# Patient Record
Sex: Female | Born: 1985 | Hispanic: Yes | Marital: Married | Smoking: Never smoker
Health system: Southern US, Community
[De-identification: ages and names within clinical notes are randomized; demographics above are authoritative.]

---

## 2014-12-04 ENCOUNTER — Emergency Department (HOSPITAL_COMMUNITY): Payer: No Typology Code available for payment source

## 2014-12-04 ENCOUNTER — Encounter (HOSPITAL_COMMUNITY): Payer: Self-pay | Admitting: *Deleted

## 2014-12-04 ENCOUNTER — Emergency Department (HOSPITAL_COMMUNITY)
Admission: EM | Admit: 2014-12-04 | Discharge: 2014-12-04 | Disposition: A | Discharge status: Home / Self Care | Payer: No Typology Code available for payment source | Attending: Emergency Medicine | Admitting: Emergency Medicine

## 2014-12-04 DIAGNOSIS — S3991XA Unspecified injury of abdomen, initial encounter: Secondary | ICD-10-CM | POA: Insufficient documentation

## 2014-12-04 DIAGNOSIS — Y92410 Unspecified street and highway as the place of occurrence of the external cause: Secondary | ICD-10-CM | POA: Insufficient documentation

## 2014-12-04 DIAGNOSIS — Z3202 Encounter for pregnancy test, result negative: Secondary | ICD-10-CM | POA: Diagnosis not present

## 2014-12-04 DIAGNOSIS — Y9389 Activity, other specified: Secondary | ICD-10-CM | POA: Insufficient documentation

## 2014-12-04 DIAGNOSIS — S0990XA Unspecified injury of head, initial encounter: Secondary | ICD-10-CM | POA: Diagnosis not present

## 2014-12-04 DIAGNOSIS — S3992XA Unspecified injury of lower back, initial encounter: Secondary | ICD-10-CM | POA: Diagnosis not present

## 2014-12-04 DIAGNOSIS — Y998 Other external cause status: Secondary | ICD-10-CM | POA: Diagnosis not present

## 2014-12-04 DIAGNOSIS — S199XXA Unspecified injury of neck, initial encounter: Secondary | ICD-10-CM | POA: Diagnosis not present

## 2014-12-04 DIAGNOSIS — T07XXXA Unspecified multiple injuries, initial encounter: Secondary | ICD-10-CM

## 2014-12-04 LAB — I-STAT CHEM 8, ED
BUN: 10 mg/dL (ref 6–20)
Calcium, Ion: 1.16 mmol/L (ref 1.12–1.23)
Chloride: 105 mmol/L (ref 101–111)
Creatinine, Ser: 0.7 mg/dL (ref 0.44–1.00)
Glucose, Bld: 107 mg/dL — ABNORMAL HIGH (ref 65–99)
HEMATOCRIT: 38 % (ref 36.0–46.0)
HEMOGLOBIN: 12.9 g/dL (ref 12.0–15.0)
Potassium: 3.6 mmol/L (ref 3.5–5.1)
SODIUM: 140 mmol/L (ref 135–145)
TCO2: 24 mmol/L (ref 0–100)

## 2014-12-04 LAB — POC URINE PREG, ED: Preg Test, Ur: NEGATIVE

## 2014-12-04 MED ORDER — FENTANYL CITRATE (PF) 100 MCG/2ML IJ SOLN: 50.0000 ug | INTRAMUSCULAR | Status: DC | PRN

## 2014-12-04 MED ORDER — IOHEXOL 300 MG/ML  SOLN
100.0000 mL | Freq: Once | INTRAMUSCULAR | Status: AC | PRN
  Administered 2014-12-04: 100 mL via INTRAVENOUS

## 2014-12-04 MED ORDER — IBUPROFEN 800 MG PO TABS: 800.0000 mg | ORAL_TABLET | Freq: Four times a day (QID) | ORAL | Status: DC | PRN

## 2014-12-04 NOTE — ED Notes (Signed)
Pt is also c/o pain to left side of abdomen
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
## 2014-12-04 NOTE — Discharge Instructions (Signed)
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
## 2019-01-14 ENCOUNTER — Telehealth: Payer: Self-pay

## 2019-01-14 NOTE — Telephone Encounter (Signed)
Pt. Eligibility is 01/14/2019 till 01/14/2020 with [facility]. Faxed over pt. [facility] application and other docs.  -Layna Roeper 

## 2019-02-04 ENCOUNTER — Other Ambulatory Visit: Payer: Self-pay

## 2019-02-04 ENCOUNTER — Ambulatory Visit: Payer: Self-pay | Admitting: Physician Assistant

## 2019-02-04 ENCOUNTER — Encounter: Payer: Self-pay | Admitting: Physician Assistant

## 2019-02-04 VITALS — BP 114/80 | HR 81 | Temp 98.1°F | Wt 183.6 lb

## 2019-02-04 DIAGNOSIS — Z7689 Persons encountering health services in other specified circumstances: Secondary | ICD-10-CM

## 2019-02-04 NOTE — Progress Notes (Signed)
BP 114/80   Pulse 81   Temp 98.1 F (36.7 C)   Wt 183 lb 9.6 oz (83.3 kg)   SpO2 99%    Subjective:    Patient ID: Holly West, female    DOB: 1985/10/16, 33 y.o.   MRN: [id number]  HPI: Holly West is a 33 y.o. female presenting on 02/04/2019 for New Patient (Initial Visit) (previous patient at [facility] pt last went about 2 months ago and had f/u appt scheduled to f/u. LPN explained to pt that she is to pick which PCP she should go to. pt states she will come to [facility] and will cancel her f/u appointment with [facility]) and Joint Pain (for about more than a year. pt states pain have become for frequent and more painful. pain is on her knees, fingers, and arms. pt states she has taken IBU for pain and it helps.)   HPI   Pt had a negative covid 19 screening questionnaire   Chief Complaint  Patient presents with  . New Patient (Initial Visit)    previous patient at [facility] pt last went about 2 months ago and had f/u appt scheduled to f/u. LPN explained to pt that she is to pick which PCP she should go to. pt states she will come to [facility] and will cancel her f/u appointment with [place] [facility]  . Joint Pain    for about more than a year. pt states pain have become for frequent and more painful. pain is on her knees, fingers, and arms. pt states she has taken IBU for pain and it helps.      Pt's Last PAP was august 2020.    She says her IUD expires mid-November and she is going to get a new IUD.     Relevant past medical, surgical, family and social history reviewed and updated as indicated. Interim medical history since our last visit reviewed. Allergies and medications reviewed and updated.   Current Outpatient Medications:  .  paragard intrauterine copper IUD IUD, 1 each by Intrauterine route once., Disp: , Rfl:  .  Prenatal Vit-Fe Fumarate-FA (PRENATAL VITAMIN PO), Take 1 tablet by mouth daily., Disp: , Rfl:  .  VITAMIN D PO, Take 1 tablet by mouth daily., Disp: , Rfl:      Review of  Systems  Per HPI unless specifically indicated above     Objective:    BP 114/80   Pulse 81   Temp 98.1 F (36.7 C)   Wt 183 lb 9.6 oz (83.3 kg)   SpO2 99%   Wt Readings from Last 3 Encounters:  02/04/19 183 lb 9.6 oz (83.3 kg)  12/04/14 182 lb (82.6 kg)    Physical Exam Vitals signs and nursing note reviewed.  Constitutional:      General: She is not in acute distress.    Appearance: She is not ill-appearing.  HENT:     Head: Normocephalic and atraumatic.  Pulmonary:     Effort: Pulmonary effort is normal. No respiratory distress.  Neurological:     Mental Status: She is alert and oriented to person, place, and time.     Gait: Gait normal.  Psychiatric:        Attention and Perception: Attention normal.        Speech: Speech normal.        Behavior: Behavior is cooperative.         Assessment & Plan:   Encounter Diagnosis  Name Primary?  . Encounter to establish  care Yes    While talking with pt and reviewing history and current conditions, pt decided to contintue with [facility] where she she was last seen about 2 months ago and she has appointment to be seen there again in November

## 2020-01-09 ENCOUNTER — Other Ambulatory Visit: Payer: Self-pay

## 2020-01-09 ENCOUNTER — Emergency Department (HOSPITAL_COMMUNITY): Payer: No Typology Code available for payment source

## 2020-01-09 ENCOUNTER — Emergency Department (HOSPITAL_COMMUNITY)
Admission: EM | Admit: 2020-01-09 | Discharge: 2020-01-09 | Disposition: A | Discharge status: Home / Self Care | Payer: No Typology Code available for payment source | Attending: Emergency Medicine | Admitting: Emergency Medicine

## 2020-01-09 ENCOUNTER — Encounter (HOSPITAL_COMMUNITY): Payer: Self-pay | Admitting: Emergency Medicine

## 2020-01-09 DIAGNOSIS — R079 Chest pain, unspecified: Secondary | ICD-10-CM | POA: Diagnosis present

## 2020-01-09 DIAGNOSIS — R0789 Other chest pain: Secondary | ICD-10-CM

## 2020-01-09 LAB — CBC
HCT: 38.3 % (ref 36.0–46.0)
Hemoglobin: 12.9 g/dL (ref 12.0–15.0)
MCH: 29.8 pg (ref 26.0–34.0)
MCHC: 33.7 g/dL (ref 30.0–36.0)
MCV: 88.5 fL (ref 80.0–100.0)
Platelets: 371 10*3/uL (ref 150–400)
RBC: 4.33 MIL/uL (ref 3.87–5.11)
RDW: 12.5 % (ref 11.5–15.5)
WBC: 11.9 10*3/uL — ABNORMAL HIGH (ref 4.0–10.5)
nRBC: 0 % (ref 0.0–0.2)

## 2020-01-09 LAB — TROPONIN I (HIGH SENSITIVITY)
Troponin I (High Sensitivity): <2 ng/L (ref <18)
Troponin I (High Sensitivity): <2 ng/L (ref <18)

## 2020-01-09 LAB — BASIC METABOLIC PANEL
Anion gap: 8 (ref 5–15)
BUN: 9 mg/dL (ref 6–20)
CO2: 24 mmol/L (ref 22–32)
Calcium: 8.6 mg/dL — ABNORMAL LOW (ref 8.9–10.3)
Chloride: 100 mmol/L (ref 98–111)
Creatinine, Ser: 0.51 mg/dL (ref 0.44–1.00)
GFR calc Af Amer: >60 mL/min (ref >60)
GFR calc non Af Amer: >60 mL/min (ref >60)
Glucose, Bld: 94 mg/dL (ref 70–99)
Potassium: 3.5 mmol/L (ref 3.5–5.1)
Sodium: 132 mmol/L — ABNORMAL LOW (ref 135–145)

## 2020-01-09 MED ORDER — HYDROCODONE-ACETAMINOPHEN 5-325 MG PO TABS
1.0000 | ORAL_TABLET | Freq: Once | ORAL | Status: AC
  Administered 2020-01-09: 1 via ORAL
  Filled 2020-01-09: qty 1

## 2020-01-09 MED ORDER — IBUPROFEN 800 MG PO TABS: 800.0000 mg | ORAL_TABLET | Freq: Three times a day (TID) | ORAL | 1 refills | Status: AC | PRN

## 2020-01-09 NOTE — Discharge Instructions (Addendum)
Follow up next week if not improving. °

## 2020-01-09 NOTE — ED Triage Notes (Signed)
Pt c/o sudden onset of LT sided CP that radiates down LT arm, to LT jaw, and to the LT side of her head. Pt states she feels SOB when chest pain occurs. Reports pain began this morning shortly after she woke up. States the pain feels like a "pinching, sore" pain.

## 2020-01-09 NOTE — ED Provider Notes (Signed)
[facility] EMERGENCY DEPARTMENT Provider Note   CSN: [id number] Arrival date & time: 01/09/20  1620     History Chief Complaint  Patient presents with  . Chest Pain    Holly West is a 34 y.o. female.  Patient complains of left chest pain worse with movement. No history of trauma no shortness of breath no sweating  The history is provided by the patient and medical records. A language interpreter was used.  Chest Pain Pain location:  L chest Pain quality: aching   Pain radiates to:  Does not radiate Pain severity:  Moderate Onset quality:  Sudden Timing:  Constant Progression:  Worsening Chronicity:  New Context: not breathing   Associated symptoms: no abdominal pain, no back pain, no cough, no fatigue and no headache        History reviewed. No pertinent past medical history.  There are no problems to display for this patient.   History reviewed. No pertinent surgical history.   OB History   No obstetric history on file.     No family history on file.  Social History   Tobacco Use  . Smoking status: Never Smoker  . Smokeless tobacco: Never Used  Vaping Use  . Vaping Use: Never used  Substance Use Topics  . Alcohol use: Not Currently  . Drug use: Not Currently    Home Medications Prior to Admission medications   Medication Sig Start Date End Date Taking? Authorizing Provider  acetaminophen (TYLENOL) 500 MG tablet Take 500 mg by mouth daily as needed for moderate pain.   Yes [provider]  levonorgestrel (MIRENA) 20 MCG/24HR IUD 1 each by Intrauterine route once.   Yes [provider]  ibuprofen (ADVIL) 800 MG tablet Take 1 tablet (800 mg total) by mouth every 8 (eight) hours as needed for moderate pain. 01/09/20   Bethann Berkshire, MD    Allergies    Patient has no known allergies.  Review of Systems   Review of Systems  Constitutional: Negative for appetite change and fatigue.  HENT: Negative for congestion, ear discharge  and sinus pressure.   Eyes: Negative for discharge.  Respiratory: Negative for cough.   Cardiovascular: Positive for chest pain.  Gastrointestinal: Negative for abdominal pain and diarrhea.  Genitourinary: Negative for frequency and hematuria.  Musculoskeletal: Negative for back pain.  Skin: Negative for rash.  Neurological: Negative for seizures and headaches.  Psychiatric/Behavioral: Negative for hallucinations.    Physical Exam Updated Vital Signs BP 128/81   Pulse 84   Temp 97.7 F (36.5 C) (Oral)   Resp 13   Ht 4\' 9"  (1.448 m)   Wt 88 kg   LMP 01/03/2020 (Approximate)   SpO2 100%   BMI 41.98 kg/m   Physical Exam Vitals and nursing note reviewed.  Constitutional:      Appearance: She is well-developed.  HENT:     Head: Normocephalic.     Nose: Nose normal.  Eyes:     General: No scleral icterus.    Conjunctiva/sclera: Conjunctivae normal.  Neck:     Thyroid: No thyromegaly.  Cardiovascular:     Rate and Rhythm: Normal rate and regular rhythm.     Heart sounds: No murmur heard.  No friction rub. No gallop.   Pulmonary:     Breath sounds: No stridor. No wheezing or rales.  Chest:     Chest wall: Tenderness present.  Abdominal:     General: There is no distension.     Tenderness: There  is no abdominal tenderness. There is no rebound.  Musculoskeletal:        General: Normal range of motion.     Cervical back: Neck supple.  Lymphadenopathy:     Cervical: No cervical adenopathy.  Skin:    Findings: No erythema or rash.  Neurological:     Mental Status: She is alert and oriented to person, place, and time.     Motor: No abnormal muscle tone.     Coordination: Coordination normal.  Psychiatric:        Behavior: Behavior normal.     ED Results / Procedures / Treatments   Labs (all labs ordered are listed, but only abnormal results are displayed) Labs Reviewed  BASIC METABOLIC PANEL - Abnormal; Notable for the following components:      Result Value    Sodium 132 (*)    Calcium 8.6 (*)    All other components within normal limits  CBC - Abnormal; Notable for the following components:   WBC 11.9 (*)    All other components within normal limits  TROPONIN I (HIGH SENSITIVITY)  TROPONIN I (HIGH SENSITIVITY)    EKG EKG Interpretation  Date/Time:  Sunday January 09 2020 16:27:28 EDT Ventricular Rate:  90 PR Interval:  128 QRS Duration: 84 QT Interval:  372 QTC Calculation: 455 R Axis:   48 Text Interpretation: Normal sinus rhythm Normal ECG Confirmed by Bethann Berkshire [phone]) on 01/09/2020 7:50:21 PM   Radiology DG Chest 2 View  Result Date: 01/09/2020 CLINICAL DATA:  Chest pain and shortness of breath. EXAM: CHEST - 2 VIEW COMPARISON:  None. FINDINGS: There are rounded densities projecting over the bilateral lower lung zones. There is no focal infiltrate. No large pleural effusion. No pneumothorax. The heart size is unremarkable. There is no acute osseous abnormality. IMPRESSION: 1. No acute cardiopulmonary process. 2. Rounded densities projecting over the bilateral lower lung zones may represent nipple shadows. A repeat two-view radiograph with nipple markers as an outpatient would be useful for further evaluation. Electronically Signed   By: Katherine Mantle M.D.   On: 01/09/2020 16:51    Procedures Procedures (including critical care time)  Medications Ordered in ED Medications  HYDROcodone-acetaminophen (NORCO/VICODIN) 5-325 MG per tablet 1 tablet (1 tablet Oral Given 01/09/20 1714)    ED Course  I have reviewed the triage vital signs and the nursing notes.  Pertinent labs & imaging results that were available during my care of the patient were reviewed by me and considered in my medical decision making (see chart for details).    MDM Rules/Calculators/A&P                          Labs unremarkable. Patient with chest wall pain. She is given Motrin will follow-up as needed     This patient presents to the ED for  concern of chest pain, this involves an extensive number of treatment options, and is a complaint that carries with it a high risk of complications and morbidity.  The differential diagnosis includes MI chest wall pain  Lab Tests:   I Ordered, reviewed, and interpreted labs, which included CBC with mild leukocytosis troponin normal  Medicines ordered:   I ordered medication Motrin for pain  Imaging Studies ordered:   I ordered imaging studies which included chest x-ray  I independently visualized and interpreted imaging which showed negative  Additional history obtained:   Additional history obtained from records  Previous records obtained and  reviewed .  Consultations Obtained:    Reevaluation:  After the interventions stated above, I reevaluated the patient and found improved  Critical Interventions:  .  \  Final Clinical Impression(s) / ED Diagnoses Final diagnoses:  Atypical chest pain    Rx / DC Orders ED Discharge Orders         Ordered    ibuprofen (ADVIL) 800 MG tablet  Every 8 hours PRN        01/09/20 1956           Bethann Berkshire, MD 01/09/20 2001

## 2020-01-21 ENCOUNTER — Other Ambulatory Visit: Payer: Self-pay

## 2020-01-21 ENCOUNTER — Encounter: Payer: Self-pay | Admitting: Emergency Medicine

## 2020-01-21 ENCOUNTER — Ambulatory Visit
Admission: EM | Admit: 2020-01-21 | Discharge: 2020-01-21 | Disposition: A | Discharge status: Home / Self Care | Payer: No Typology Code available for payment source | Attending: Emergency Medicine | Admitting: Emergency Medicine

## 2020-01-21 DIAGNOSIS — J029 Acute pharyngitis, unspecified: Secondary | ICD-10-CM | POA: Insufficient documentation

## 2020-01-21 DIAGNOSIS — Z1152 Encounter for screening for COVID-19: Secondary | ICD-10-CM | POA: Insufficient documentation

## 2020-01-21 LAB — POCT RAPID STREP A (OFFICE): Rapid Strep A Screen: NEGATIVE

## 2020-01-21 NOTE — Discharge Instructions (Addendum)
COVID-19 test will take 2 - 7 days for results to return.  Someone will call if your result is abnormal. Strep test negative, will send out for culture and we will call you with results Get plenty of rest and push fluids Use throat lozenges such as Cepacol or Hall's for sore throat Drink warm or cool liquids, use throat lozenges, or popsicles to help alleviate symptoms Take OTC ibuprofen or tylenol as needed for pain Follow up with PCP if symptoms persists Return or go to ER if patient has any new or worsening symptoms such as fever, chills, nausea, vomiting, worsening sore throat, cough, abdominal pain, chest pain, changes in bowel or bladder habits, etc..Marland Kitchen

## 2020-01-21 NOTE — ED Triage Notes (Signed)
Sore throat started yesterday. Pt's kids sick with the same thing and tested neg for covid.

## 2020-01-21 NOTE — ED Provider Notes (Signed)
[facility]   [id number] 01/21/20 Arrival Time: 1545  EU:MPNT THROAT  SUBJECTIVE: History from: patient and family.  Holly West is a 34 y.o. female who presents to the urgent care for complaint of sore throat that started yesterday.  Reports kids at home have the same symptom and tested negative for Covid.  Has tried OTC medication without relief.  Symptoms are made worse with swallowing, but tolerating liquids and own secretions without difficulty.  Denies previous symptoms in the past.   Denies chills, fever, nausea, vomiting, diarrhea   ROS: As per HPI.  All other pertinent ROS negative.     History reviewed. No pertinent past medical history. History reviewed. No pertinent surgical history. No Known Allergies No current facility-administered medications on file prior to encounter.   Current Outpatient Medications on File Prior to Encounter  Medication Sig Dispense Refill  . paragard intrauterine copper IUD IUD 1 each by Intrauterine route once.    . Prenatal Vit-Fe Fumarate-FA (PRENATAL VITAMIN PO) Take 1 tablet by mouth daily.    Marland Kitchen VITAMIN D PO Take 1 tablet by mouth daily.     Social History   Socioeconomic History  . Marital status: Married    Spouse name: Not on file  . Number of children: Not on file  . Years of education: Not on file  . Highest education level: Not on file  Occupational History  . Not on file  Tobacco Use  . Smoking status: Never Smoker  . Smokeless tobacco: Never Used  Vaping Use  . Vaping Use: Never used  Substance and Sexual Activity  . Alcohol use: Yes    Comment: rare  . Drug use: No  . Sexual activity: Yes    Birth control/protection: I.U.D.  Other Topics Concern  . Not on file  Social History Narrative  . Not on file   Social Determinants of Health   Financial Resource Strain:   . Difficulty of Paying Living Expenses: Not on file  Food Insecurity:   . Worried About Programme researcher, broadcasting/film/video in the Last Year: Not on file   . Ran Out of Food in the Last Year: Not on file  Transportation Needs:   . Lack of Transportation (Medical): Not on file  . Lack of Transportation (Non-Medical): Not on file  Physical Activity:   . Days of Exercise per Week: Not on file  . Minutes of Exercise per Session: Not on file  Stress:   . Feeling of Stress : Not on file  Social Connections:   . Frequency of Communication with Friends and Family: Not on file  . Frequency of Social Gatherings with Friends and Family: Not on file  . Attends Religious Services: Not on file  . Active Member of Clubs or Organizations: Not on file  . Attends Banker Meetings: Not on file  . Marital Status: Not on file  Intimate Partner Violence:   . Fear of Current or Ex-Partner: Not on file  . Emotionally Abused: Not on file  . Physically Abused: Not on file  . Sexually Abused: Not on file   Family History  Problem Relation Age of Onset  . Hypertension Mother   . Hyperlipidemia Mother   . Diabetes Mother   . Hypertension Father   . Hyperlipidemia Father   . Diabetes Father   . Heart disease Father   . Hypertension Maternal Grandmother   . Diabetes Maternal Grandmother   . Heart disease Paternal Grandmother   .  Hypertension Paternal Grandmother     OBJECTIVE:  Vitals:   01/21/20 1637 01/21/20 1639  BP:  123/85  Pulse:  92  Resp:  19  Temp:  98.8 F (37.1 C)  TempSrc:  Oral  SpO2:  98%  Weight: 196 lb (88.9 kg)   Height: 4\' 9"  (1.448 m)      General appearance: alert; appears fatigued, but nontoxic, speaking in full sentences and managing own secretions HEENT: NCAT; Ears: EACs clear, TMs pearly gray with visible cone of light, without erythema; Eyes: PERRL, EOMI grossly; Nose: no obvious rhinorrhea; Throat: oropharynx clear, tonsils 1+ and mildly erythematous without white tonsillar exudates, uvula midline Neck: supple without LAD Lungs: CTA bilaterally without adventitious breath sounds; cough absent Heart:  regular rate and rhythm.  Radial pulses 2+ symmetrical bilaterally Skin: warm and dry Psychological: alert and cooperative; normal mood and affect  LABS: Results for orders placed or performed during the hospital encounter of 01/21/20 (from the past 24 hour(s))  POCT rapid strep A     Status: None   Collection Time: 01/21/20  4:46 PM  Result Value Ref Range   Rapid Strep A Screen Negative Negative     ASSESSMENT & PLAN:  1. Sore throat   2. Encounter for screening for COVID-19     No orders of the defined types were placed in this encounter.   Discharge instructions  COVID-19 test will take 2 - 7 days for results to return.  Someone will call if your result is abnormal. Strep test negative, will send out for culture and we will call you with results Get plenty of rest and push fluids Use throat lozenges such as Cepacol or Hall's for sore throat Drink warm or cool liquids, use throat lozenges, or popsicles to help alleviate symptoms Take OTC ibuprofen or tylenol as needed for pain Follow up with PCP if symptoms persists Return or go to ER if patient has any new or worsening symptoms such as fever, chills, nausea, vomiting, worsening sore throat, cough, abdominal pain, chest pain, changes in bowel or bladder habits, etc...  Reviewed expectations re: course of current medical issues. Questions answered. Outlined signs and symptoms indicating need for more acute intervention. Patient verbalized understanding. After Visit Summary given.     03/22/20, FNP 01/21/20 [phone]

## 2020-01-23 LAB — SARS-COV-2, NAA 2 DAY TAT

## 2020-01-23 LAB — CULTURE, GROUP A STREP (THRC): Special Requests: NORMAL

## 2020-01-23 LAB — NOVEL CORONAVIRUS, NAA: SARS-CoV-2, NAA: NOT DETECTED

## 2020-01-24 ENCOUNTER — Telehealth: Payer: Self-pay

## 2020-01-24 LAB — CULTURE, GROUP A STREP (THRC)

## 2020-01-24 NOTE — Telephone Encounter (Signed)
Patient called and she was informed that her COVID-19 test done 01/21/20 was negative Not Detected.  She verbalized understanding.

## 2020-02-16 ENCOUNTER — Encounter: Payer: Self-pay | Admitting: Emergency Medicine
